# Patient Record
Sex: Female | Born: 1965 | Race: White | Hispanic: No | Marital: Married | Smoking: Never smoker
Health system: Southern US, Community
[De-identification: ages and names within clinical notes are randomized; demographics above are authoritative.]

## PROBLEM LIST (undated history)

## (undated) DIAGNOSIS — R7303 Prediabetes: Secondary | ICD-10-CM

## (undated) DIAGNOSIS — I10 Essential (primary) hypertension: Secondary | ICD-10-CM

## (undated) DIAGNOSIS — N201 Calculus of ureter: Secondary | ICD-10-CM

## (undated) DIAGNOSIS — Z87442 Personal history of urinary calculi: Secondary | ICD-10-CM

## (undated) DIAGNOSIS — R3915 Urgency of urination: Secondary | ICD-10-CM

## (undated) DIAGNOSIS — K219 Gastro-esophageal reflux disease without esophagitis: Secondary | ICD-10-CM

---

## 1988-11-01 HISTORY — PX: LAPAROSCOPIC TUBAL LIGATION: SHX1937

## 1999-11-09 ENCOUNTER — Other Ambulatory Visit: Admission: RE | Admit: 1999-11-09 | Discharge: 1999-11-09 | Payer: Self-pay | Admitting: Obstetrics and Gynecology

## 2011-11-18 ENCOUNTER — Emergency Department (HOSPITAL_COMMUNITY): Payer: PRIVATE HEALTH INSURANCE

## 2011-11-18 ENCOUNTER — Encounter (HOSPITAL_COMMUNITY): Payer: Self-pay

## 2011-11-18 ENCOUNTER — Emergency Department (HOSPITAL_COMMUNITY)
Admission: EM | Admit: 2011-11-18 | Discharge: 2011-11-18 | Disposition: A | Discharge status: Home / Self Care | Payer: PRIVATE HEALTH INSURANCE | Attending: Emergency Medicine | Admitting: Emergency Medicine

## 2011-11-18 DIAGNOSIS — R112 Nausea with vomiting, unspecified: Secondary | ICD-10-CM | POA: Insufficient documentation

## 2011-11-18 DIAGNOSIS — R109 Unspecified abdominal pain: Secondary | ICD-10-CM | POA: Insufficient documentation

## 2011-11-18 LAB — COMPREHENSIVE METABOLIC PANEL
AST: 17 U/L (ref 0–37)
Albumin: 4.5 g/dL (ref 3.5–5.2)
BUN: 7 mg/dL (ref 6–23)
Calcium: 9.7 mg/dL (ref 8.4–10.5)
Chloride: 103 mEq/L (ref 96–112)
Creatinine, Ser: 0.73 mg/dL (ref 0.50–1.10)
Total Bilirubin: 0.4 mg/dL (ref 0.3–1.2)

## 2011-11-18 LAB — URINE MICROSCOPIC-ADD ON

## 2011-11-18 LAB — CBC
Hemoglobin: 13.8 g/dL (ref 12.0–15.0)
MCV: 84.2 fL (ref 78.0–100.0)
Platelets: 269 10*3/uL (ref 150–400)
RBC: 4.75 MIL/uL (ref 3.87–5.11)
WBC: 5.4 10*3/uL (ref 4.0–10.5)

## 2011-11-18 LAB — URINALYSIS, ROUTINE W REFLEX MICROSCOPIC
Hgb urine dipstick: NEGATIVE
Nitrite: NEGATIVE
Specific Gravity, Urine: 1.021 (ref 1.005–1.030)
Urobilinogen, UA: 0.2 mg/dL (ref 0.0–1.0)
pH: 8.5 — ABNORMAL HIGH (ref 5.0–8.0)

## 2011-11-18 LAB — LIPASE, BLOOD: Lipase: 26 U/L (ref 11–59)

## 2011-11-18 LAB — PREGNANCY, URINE: Preg Test, Ur: NEGATIVE

## 2011-11-18 MED ORDER — PANTOPRAZOLE SODIUM 40 MG IV SOLR
40.0000 mg | Freq: Once | INTRAVENOUS | Status: AC
  Administered 2011-11-18: 40 mg via INTRAVENOUS
  Filled 2011-11-18: qty 40

## 2011-11-18 MED ORDER — HYDROMORPHONE HCL PF 1 MG/ML IJ SOLN
0.5000 mg | Freq: Once | INTRAMUSCULAR | Status: AC
  Administered 2011-11-18: 0.5 mg via INTRAVENOUS
  Filled 2011-11-18: qty 1

## 2011-11-18 MED ORDER — ONDANSETRON HCL 4 MG/2ML IJ SOLN
4.0000 mg | Freq: Once | INTRAMUSCULAR | Status: AC
  Administered 2011-11-18: 4 mg via INTRAVENOUS
  Filled 2011-11-18: qty 2

## 2011-11-18 MED ORDER — ONDANSETRON HCL 8 MG PO TABS: 8.0000 mg | ORAL_TABLET | Freq: Three times a day (TID) | ORAL | Status: AC | PRN

## 2011-11-18 MED ORDER — SODIUM CHLORIDE 0.9 % IV BOLUS (SEPSIS)
1000.0000 mL | Freq: Once | INTRAVENOUS | Status: AC
  Administered 2011-11-18: 1000 mL via INTRAVENOUS

## 2011-11-18 NOTE — ED Provider Notes (Signed)
History     CSN: [id number]  Arrival date & time 11/18/11  3435   First MD Initiated Contact with Patient 11/18/11 [phone]      Chief Complaint  Patient presents with  . Abdominal Pain  . Nausea  . Emesis    (Consider location/radiation/quality/duration/timing/severity/associated sxs/prior treatment) Patient is a 46 y.o. female presenting with abdominal pain and vomiting. The history is provided by the patient.  Abdominal Pain The primary symptoms of the illness include abdominal pain and vomiting. The primary symptoms of the illness do not include fever or shortness of breath.  Symptoms associated with the illness do not include chills or back pain.  Emesis  Associated symptoms include abdominal pain. Pertinent negatives include no chills, no fever and no headaches.  pt w onset nv last pm, multiple episodes since. Clear to sl yellowish. Not bloody. Mid abd cramping comes and goes, no constant, focal pain. Pt unaware of exacerbating or alleviating factors, no known bad food ingestion or ill contacts. No diarrhea or constipation. No prior abd surgery. lnmp 3 weeks ago, no discharge or bleeding. No gu c/o. No back or flank pain. No hx gallstones or pud. No fever or chills. No cp or sob.   History reviewed. No pertinent past medical history.  History reviewed. No pertinent past surgical history.  History reviewed. No pertinent family history.  History  Substance Use Topics  . Smoking status: Not on file  . Smokeless tobacco: Not on file  . Alcohol Use: No    OB History    Grav Para Term Preterm Abortions TAB SAB Ect Mult Living                  Review of Systems  Constitutional: Negative for fever and chills.  HENT: Negative for neck pain.   Eyes: Negative for redness.  Respiratory: Negative for shortness of breath.   Cardiovascular: Negative for chest pain.  Gastrointestinal: Positive for vomiting and abdominal pain.  Genitourinary: Negative for flank pain.    Musculoskeletal: Negative for back pain.  Skin: Negative for rash.  Neurological: Negative for headaches.  Hematological: Does not bruise/bleed easily.  Psychiatric/Behavioral: Negative for confusion.    Allergies  Review of patient's allergies indicates no known allergies.  Home Medications  No current outpatient prescriptions on file.  BP 155/92  Pulse 86  Temp(Src) 98 F (36.7 C) (Oral)  Resp 20  SpO2 97%  Physical Exam  Nursing note and vitals reviewed. Constitutional: She is oriented to person, place, and time. She appears well-developed and well-nourished. No distress.  HENT:  Head: Atraumatic.  Eyes: Conjunctivae are normal. No scleral icterus.  Neck: Neck supple. No tracheal deviation present.  Cardiovascular: Normal rate, regular rhythm, normal heart sounds and intact distal pulses.   Pulmonary/Chest: Effort normal and breath sounds normal. No respiratory distress.  Abdominal: Soft. Normal appearance and bowel sounds are normal. She exhibits no distension and no mass. There is no tenderness. There is no rebound and no guarding.  Musculoskeletal: She exhibits no edema.  Neurological: She is alert and oriented to person, place, and time.  Skin: Skin is warm and dry. No rash noted.  Psychiatric: She has a normal mood and affect.    ED Course  Procedures (including critical care time)   Labs Reviewed  LIPASE, BLOOD  COMPREHENSIVE METABOLIC PANEL  CBC  PREGNANCY, URINE  URINALYSIS, ROUTINE W REFLEX MICROSCOPIC    Results for orders placed during the hospital encounter of 11/18/11  LIPASE, BLOOD  Component Value Range   Lipase 26  11 - 59 (U/L)  COMPREHENSIVE METABOLIC PANEL      Component Value Range   Sodium 141  135 - 145 (mEq/L)   Potassium 3.5  3.5 - 5.1 (mEq/L)   Chloride 103  96 - 112 (mEq/L)   CO2 26  19 - 32 (mEq/L)   Glucose, Bld 155 (*) 70 - 99 (mg/dL)   BUN 7  6 - 23 (mg/dL)   Creatinine, Ser 6.57  0.50 - 1.10 (mg/dL)   Calcium 9.7   8.4 - 10.5 (mg/dL)   Total Protein 8.4 (*) 6.0 - 8.3 (g/dL)   Albumin 4.5  3.5 - 5.2 (g/dL)   AST 17  0 - 37 (U/L)   ALT 21  0 - 35 (U/L)   Alkaline Phosphatase 72  39 - 117 (U/L)   Total Bilirubin 0.4  0.3 - 1.2 (mg/dL)   GFR calc non Af Amer >90  >90 (mL/min)   GFR calc Af Amer >90  >90 (mL/min)  CBC      Component Value Range   WBC 5.4  4.0 - 10.5 (K/uL)   RBC 4.75  3.87 - 5.11 (MIL/uL)   Hemoglobin 13.8  12.0 - 15.0 (g/dL)   HCT 84.6  96.2 - 95.2 (%)   MCV 84.2  78.0 - 100.0 (fL)   MCH 29.1  26.0 - 34.0 (pg)   MCHC 34.5  30.0 - 36.0 (g/dL)   RDW 84.1  32.4 - 40.1 (%)   Platelets 269  150 - 400 (K/uL)  PREGNANCY, URINE      Component Value Range   Preg Test, Ur NEGATIVE    URINALYSIS, ROUTINE W REFLEX MICROSCOPIC      Component Value Range   Color, Urine YELLOW  YELLOW    APPearance TURBID (*) CLEAR    Specific Gravity, Urine 1.021  1.005 - 1.030    pH 8.5 (*) 5.0 - 8.0    Glucose, UA NEGATIVE  NEGATIVE (mg/dL)   Hgb urine dipstick NEGATIVE  NEGATIVE    Bilirubin Urine NEGATIVE  NEGATIVE    Ketones, ur NEGATIVE  NEGATIVE (mg/dL)   Protein, ur 30 (*) NEGATIVE (mg/dL)   Urobilinogen, UA 0.2  0.0 - 1.0 (mg/dL)   Nitrite NEGATIVE  NEGATIVE    Leukocytes, UA SMALL (*) NEGATIVE   URINE MICROSCOPIC-ADD ON      Component Value Range   Squamous Epithelial / LPF RARE  RARE    WBC, UA 0-2  <3 (WBC/hpf)   Bacteria, UA FEW (*) RARE    Urine-Other AMORPHOUS URATES/PHOSPHATES     US Abdomen Complete  11/18/2011  *RADIOLOGY REPORT*  Clinical Data:  Vomiting  ABDOMINAL ULTRASOUND COMPLETE  Comparison:  None.  Findings:  Gallbladder:  No gallstones, gallbladder wall thickening, or pericholecystic fluid. Negative sonographic Murphy's sign.  Common Bile Duct:  Within normal limits in caliber. Measures 5 mm.  Liver: No focal mass lesion identified. There is diffuse fatty infiltration.  IVC:  Appears normal.  Pancreas:  No abnormality identified.  Spleen:  Within normal limits in size and  echotexture.  Right kidney:  Normal in size and parenchymal echogenicity.  No evidence of mass or hydronephrosis.  Left kidney:  Normal in size and parenchymal echogenicity. Dromedary hump incidentally noted. No evidence of mass or hydronephrosis.  Abdominal Aorta:  No aneurysm identified.  IMPRESSION: There is diffuse fatty infiltration of the liver.  Original Report Authenticated By: Brandon Melnick, M.D.  MDM  Iv ns bolus. Dilaudid .5 mg iv. zofran iv. Labs.  Nursing notes reviewed, no prior ed charts to review.  2nd liter ns iv. Pt noted fam hx gallstones, and questions whether she may have gallstones ?u/s.  U/s neg for gallstones/acute processs.  Recheck improved. abd soft nt.       Suzi Roots, MD 11/18/11 [phone]

## 2011-11-18 NOTE — ED Notes (Signed)
Vital signs stable. 

## 2011-11-18 NOTE — ED Notes (Signed)
MD at bedside. 

## 2011-11-18 NOTE — ED Notes (Signed)
Pt complains of middle abd pain since last night, states that she went to bed nauseated and has been vomiting all night

## 2011-11-18 NOTE — ED Notes (Signed)
Patient transported to Ultrasound 

## 2011-11-18 NOTE — ED Notes (Signed)
Patient is resting comfortably. 

## 2011-11-18 NOTE — ED Notes (Signed)
Family at bedside. 

## 2012-11-01 HISTORY — PX: URETEROSCOPY WITH HOLMIUM LASER LITHOTRIPSY: SHX6645

## 2013-05-31 ENCOUNTER — Telehealth: Payer: Self-pay | Admitting: Family Medicine

## 2014-10-20 ENCOUNTER — Encounter (HOSPITAL_COMMUNITY): Payer: Self-pay | Admitting: Emergency Medicine

## 2014-10-20 ENCOUNTER — Emergency Department (HOSPITAL_COMMUNITY)
Admission: EM | Admit: 2014-10-20 | Discharge: 2014-10-20 | Disposition: A | Discharge status: Home / Self Care | Payer: PRIVATE HEALTH INSURANCE | Attending: Emergency Medicine | Admitting: Emergency Medicine

## 2014-10-20 ENCOUNTER — Emergency Department (HOSPITAL_COMMUNITY): Payer: PRIVATE HEALTH INSURANCE

## 2014-10-20 DIAGNOSIS — R1012 Left upper quadrant pain: Secondary | ICD-10-CM | POA: Diagnosis present

## 2014-10-20 DIAGNOSIS — Z3202 Encounter for pregnancy test, result negative: Secondary | ICD-10-CM | POA: Insufficient documentation

## 2014-10-20 DIAGNOSIS — N200 Calculus of kidney: Secondary | ICD-10-CM | POA: Insufficient documentation

## 2014-10-20 LAB — LIPASE, BLOOD: LIPASE: 33 U/L (ref 11–59)

## 2014-10-20 LAB — CBC WITH DIFFERENTIAL/PLATELET
Basophils Absolute: 0 10*3/uL (ref 0.0–0.1)
Basophils Relative: 0 % (ref 0–1)
Eosinophils Absolute: 0.1 10*3/uL (ref 0.0–0.7)
Eosinophils Relative: 1 % (ref 0–5)
HEMATOCRIT: 42.6 % (ref 36.0–46.0)
HEMOGLOBIN: 14.6 g/dL (ref 12.0–15.0)
LYMPHS ABS: 1.1 10*3/uL (ref 0.7–4.0)
LYMPHS PCT: 23 % (ref 12–46)
MCH: 31.1 pg (ref 26.0–34.0)
MCHC: 34.3 g/dL (ref 30.0–36.0)
MCV: 90.6 fL (ref 78.0–100.0)
MONO ABS: 0.5 10*3/uL (ref 0.1–1.0)
MONOS PCT: 10 % (ref 3–12)
NEUTROS ABS: 3.2 10*3/uL (ref 1.7–7.7)
Neutrophils Relative %: 66 % (ref 43–77)
Platelets: 214 10*3/uL (ref 150–400)
RBC: 4.7 MIL/uL (ref 3.87–5.11)
RDW: 12.2 % (ref 11.5–15.5)
WBC: 4.9 10*3/uL (ref 4.0–10.5)

## 2014-10-20 LAB — URINALYSIS, ROUTINE W REFLEX MICROSCOPIC
BILIRUBIN URINE: NEGATIVE
Glucose, UA: NEGATIVE mg/dL
KETONES UR: NEGATIVE mg/dL
NITRITE: NEGATIVE
Protein, ur: NEGATIVE mg/dL
Specific Gravity, Urine: 1.008 (ref 1.005–1.030)
UROBILINOGEN UA: 0.2 mg/dL (ref 0.0–1.0)
pH: 6 (ref 5.0–8.0)

## 2014-10-20 LAB — COMPREHENSIVE METABOLIC PANEL
ALK PHOS: 77 U/L (ref 39–117)
ALT: 26 U/L (ref 0–35)
ANION GAP: 15 (ref 5–15)
AST: 22 U/L (ref 0–37)
Albumin: 4.3 g/dL (ref 3.5–5.2)
BILIRUBIN TOTAL: 0.8 mg/dL (ref 0.3–1.2)
BUN: 11 mg/dL (ref 6–23)
CHLORIDE: 99 meq/L (ref 96–112)
CO2: 26 meq/L (ref 19–32)
CREATININE: 0.99 mg/dL (ref 0.50–1.10)
Calcium: 9.8 mg/dL (ref 8.4–10.5)
GFR calc Af Amer: 77 mL/min — ABNORMAL LOW (ref >90)
GFR, EST NON AFRICAN AMERICAN: 67 mL/min — AB (ref >90)
GLUCOSE: 106 mg/dL — AB (ref 70–99)
POTASSIUM: 4.2 meq/L (ref 3.7–5.3)
Sodium: 140 mEq/L (ref 137–147)
Total Protein: 8 g/dL (ref 6.0–8.3)

## 2014-10-20 LAB — URINE MICROSCOPIC-ADD ON

## 2014-10-20 LAB — I-STAT TROPONIN, ED: TROPONIN I, POC: 0 ng/mL (ref 0.00–0.08)

## 2014-10-20 LAB — POC URINE PREG, ED: Preg Test, Ur: NEGATIVE

## 2014-10-20 MED ORDER — KETOROLAC TROMETHAMINE 30 MG/ML IJ SOLN: 30.0000 mg | Freq: Once | INTRAMUSCULAR | Status: DC

## 2014-10-20 MED ORDER — IOHEXOL 300 MG/ML  SOLN
50.0000 mL | Freq: Once | INTRAMUSCULAR | Status: AC | PRN
  Administered 2014-10-20: 50 mL via ORAL

## 2014-10-20 MED ORDER — HYDROCODONE-ACETAMINOPHEN 5-325 MG PO TABS: 1.0000 | ORAL_TABLET | ORAL | Status: DC | PRN

## 2014-10-20 NOTE — ED Notes (Signed)
Pt c/o LLQ abdominal pain radiating to left flank at 0430 on 10-19-14, then has been intermittent since, with periods of exacerbation, at this time pain is dull. Pt c/o nausea, denies emesis or diarrhea.

## 2014-10-20 NOTE — Discharge Instructions (Signed)

## 2014-10-20 NOTE — ED Provider Notes (Signed)
CSN: [id number]     Arrival date & time 10/20/14  0721 History   First MD Initiated Contact with Patient 10/20/14 0725     Chief Complaint  Patient presents with  . Abdominal Pain      HPI  Patient presents with approximately 27 hours of abdominal pain.  Pain woke her from sleep.  Since onset has been intermittent, severe, sore, left upper quadrant, left lateral abdomen, nonradiating. There is associated nausea, but no fever or vomiting. No urinary changes. No other abdominal pain. No change in bowel movements. Patient completed antibiotics for urinary tract infection approximately one week ago. She states that that was her first urinary tract infection in memory.   History reviewed. No pertinent past medical history. History reviewed. No pertinent past surgical history. No family history on file. History  Substance Use Topics  . Smoking status: Never Smoker   . Smokeless tobacco: Not on file  . Alcohol Use: No   OB History    No data available     Review of Systems  Constitutional:       Per HPI, otherwise negative  HENT:       Per HPI, otherwise negative  Respiratory:       Per HPI, otherwise negative  Cardiovascular:       Per HPI, otherwise negative  Gastrointestinal: Negative for vomiting.  Endocrine:       Negative aside from HPI  Genitourinary:       Neg aside from HPI   Musculoskeletal:       Per HPI, otherwise negative  Skin: Negative.   Neurological: Negative for syncope.      Allergies  Review of patient's allergies indicates no known allergies.  Home Medications   Prior to Admission medications   Not on File   BP 132/82 mmHg  Pulse 71  Temp(Src) 97.6 F (36.4 C) (Oral)  Resp 15  SpO2 99%  LMP 06/20/2014 Physical Exam  Constitutional: She is oriented to person, place, and time. She appears well-developed and well-nourished. No distress.  HENT:  Head: Normocephalic and atraumatic.  Eyes: Conjunctivae and EOM are normal.   Cardiovascular: Normal rate and regular rhythm.   Pulmonary/Chest: Effort normal and breath sounds normal. No stridor. No respiratory distress.  Abdominal: She exhibits no distension.  Minimal tenderness to palpation about the left upper abdomen  Musculoskeletal: She exhibits no edema.  Neurological: She is alert and oriented to person, place, and time. No cranial nerve deficit.  Skin: Skin is warm and dry.  Psychiatric: She has a normal mood and affect.  Nursing note and vitals reviewed.   ED Course  Procedures (including critical care time) Labs Review Labs Reviewed  COMPREHENSIVE METABOLIC PANEL - Abnormal; Notable for the following:    Glucose, Bld 106 (*)    GFR calc non Af Amer 67 (*)    GFR calc Af Amer 77 (*)    All other components within normal limits  URINALYSIS, ROUTINE W REFLEX MICROSCOPIC - Abnormal; Notable for the following:    Hgb urine dipstick LARGE (*)    Leukocytes, UA TRACE (*)    All other components within normal limits  CBC WITH DIFFERENTIAL  LIPASE, BLOOD  URINE MICROSCOPIC-ADD ON  POC URINE PREG, ED  I-STAT TROPOININ, ED  POC URINE PREG, ED    Imaging Review Ct Abdomen Pelvis Wo Contrast  10/20/2014   CLINICAL DATA:  Left lower quadrant pain radiating to the left flank  EXAM: CT ABDOMEN AND PELVIS  WITHOUT CONTRAST  TECHNIQUE: Multidetector CT imaging of the abdomen and pelvis was performed following the standard protocol without IV contrast.  COMPARISON:  None.  FINDINGS: The lung bases are clear.  There is a 5 mm proximal left ureteral calculus with mild left hydronephrosis. No perinephric stranding is seen. The kidneys are symmetric in size without evidence for exophytic mass. The bladder is unremarkable.  The liver demonstrates no focal abnormality. The gallbladder is unremarkable. The spleen demonstrates no focal abnormality. The adrenal glands and pancreas are normal.  The stomach, duodenum, small intestine and large intestine demonstrate no wall  thickening or dilatation. There is a normal caliber appendix in the right lower quadrant without periappendiceal inflammatory changes. There is no pneumoperitoneum, pneumatosis, or portal venous gas. There is no abdominal or pelvic free fluid. There is no lymphadenopathy. The uterus and ovaries are unremarkable.  The abdominal aorta is normal in caliber.  There is degenerative disc disease at L5-S1.  IMPRESSION: 1. 5 mm proximal left ureteral calculus with mild left hydronephrosis.   Electronically Signed   By: Elige KoHetal  Patel   On: 10/20/2014 10:06     EKG Interpretation   Date/Time:  Sunday October 20 2014 07:35:33 EST Ventricular Rate:  81 PR Interval:  158 QRS Duration: 95 QT Interval:  376 QTC Calculation: 436 R Axis:   79 Text Interpretation:  Sinus rhythm Probable left atrial enlargement Sinus  rhythm Non-specific intra-ventricular conduction delay Borderline ECG  Confirmed by Gerhard MunchLOCKWOOD, Vikrant Pryce  MD [phone]([id number]) on 10/20/2014 7:48:33 AM     Update: Patient in no distress She and husband are aware of all results. MDM   Patient presents with new left-sided abdominal pain. Patient has CT e/o nephrolithiasis, w/o obstruction or concurrent infection. Patient had analgesia here and was d/c in stable condition to f/u w urology.        Gerhard Munchobert Erskine Steinfeldt, MD 10/20/14 [phone]

## 2015-04-11 ENCOUNTER — Telehealth: Payer: Self-pay | Admitting: Family Medicine

## 2015-04-11 NOTE — Telephone Encounter (Signed)
Gave patient number for the [facility]

## 2016-07-11 IMAGING — CT CT ABD-PELV W/O CM
1 series · 15 of 26 positions shown, 19 images · non-contrast
Comparison: None.

CLINICAL DATA: Left lower quadrant pain radiating to the left flank

EXAM:
CT ABDOMEN AND PELVIS WITHOUT CONTRAST
TECHNIQUE: Multidetector CT imaging of the abdomen and pelvis was performed
following the standard protocol without IV contrast.

[Series 4: lung · axial · 0.83mm/px · z∈[-112,+3]mm · 15 of 26 slices shown, 19 images]
[im 2/26  soft-tissue]
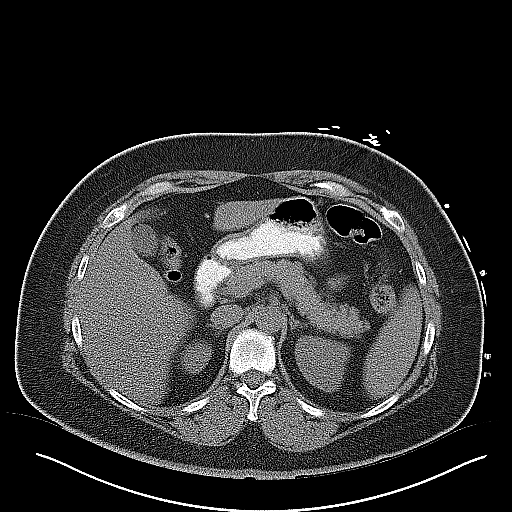
[im 2/26  bone]
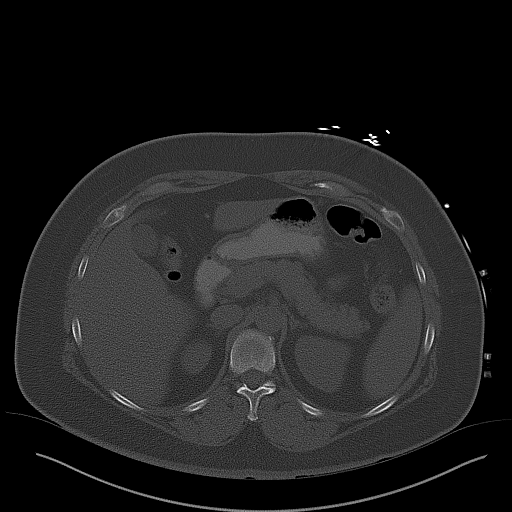
[im 4/26  soft-tissue]
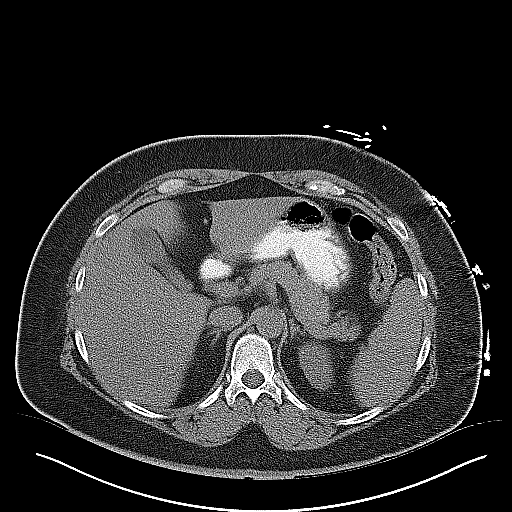
[im 6/26  soft-tissue]
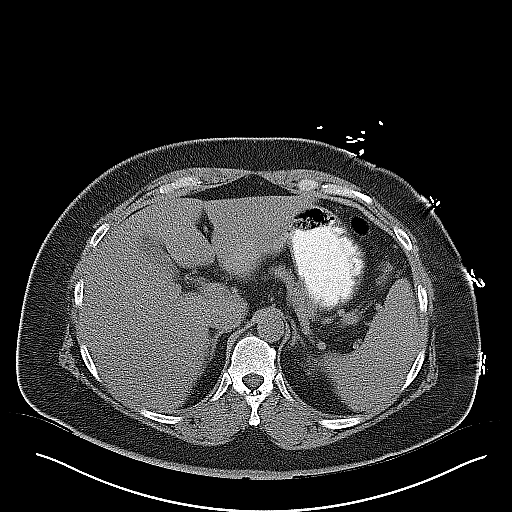
[im 8/26  soft-tissue]
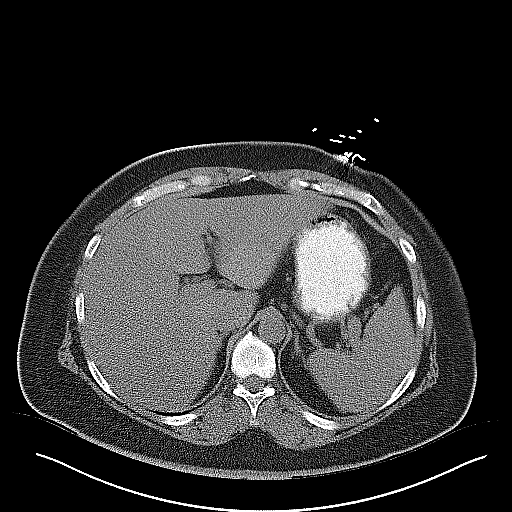
[im 10/26  soft-tissue]
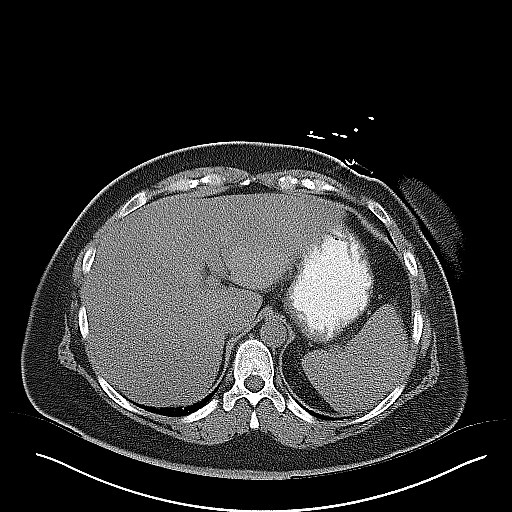
[im 12/26  soft-tissue]
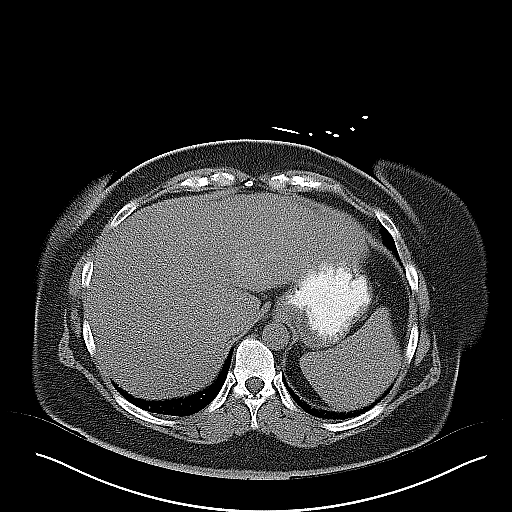
[im 14/26  soft-tissue]
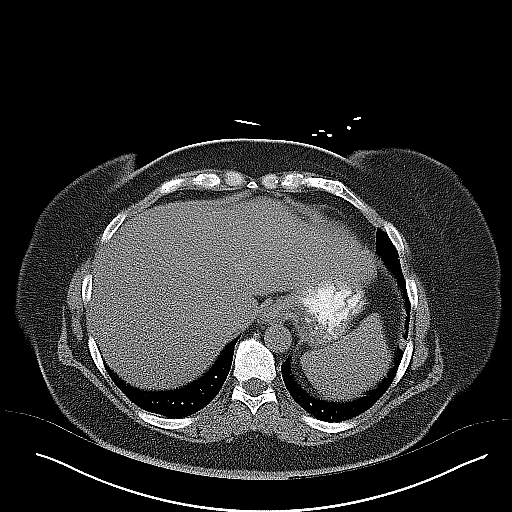
[im 15/26  soft-tissue]
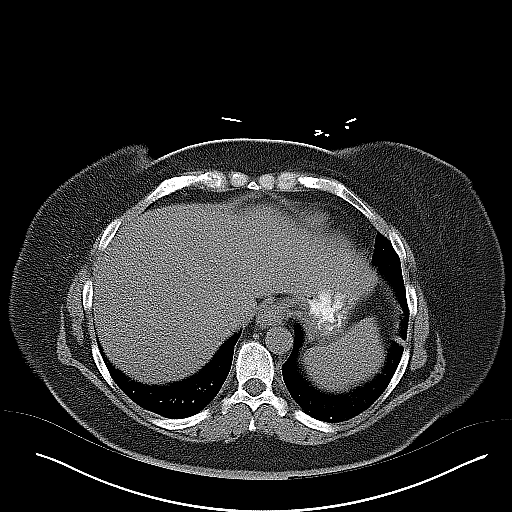
[im 17/26  soft-tissue]
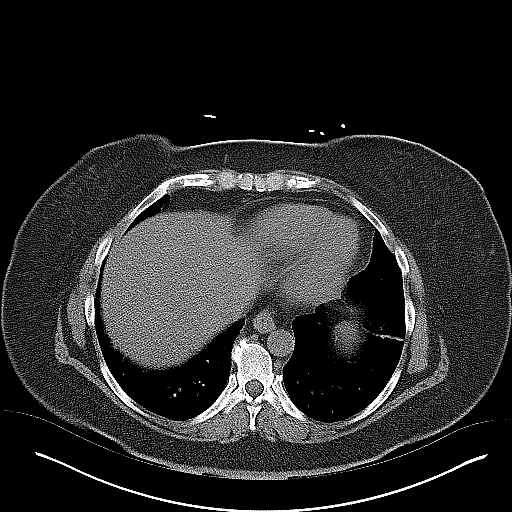
[im 17/26  bone]
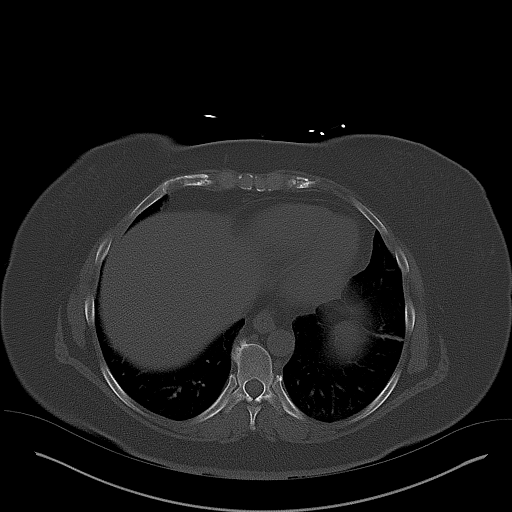
[im 19/26  soft-tissue]
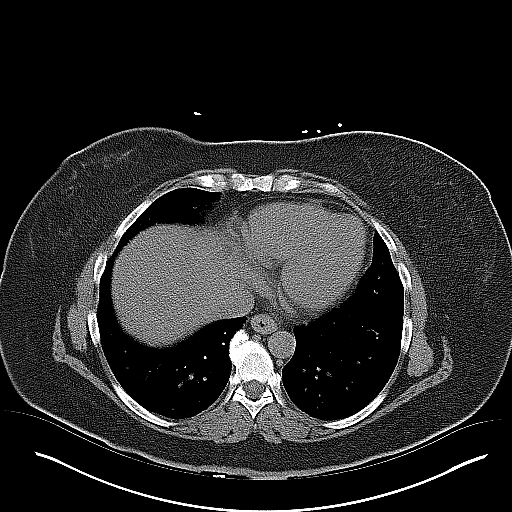
[im 21/26  soft-tissue]
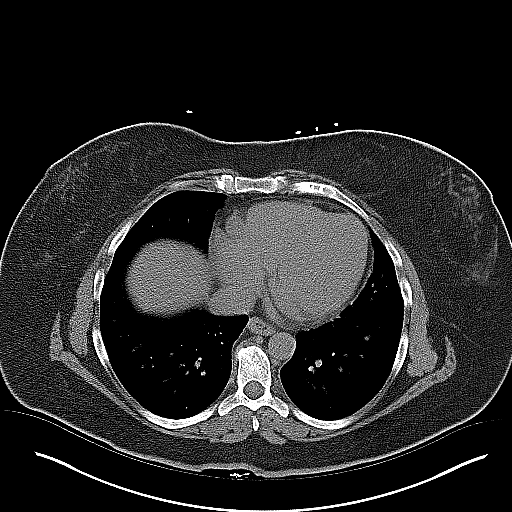
[im 22/26  lung]
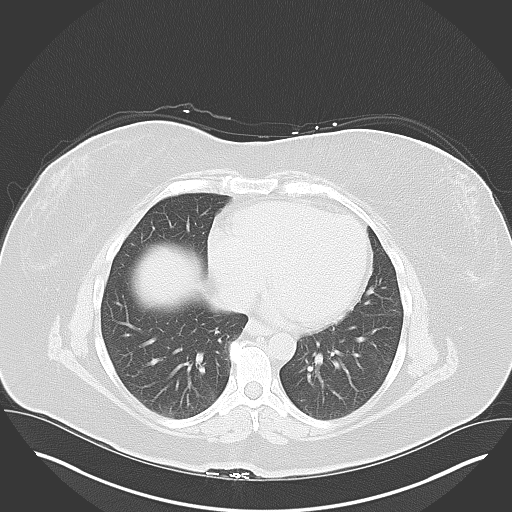
[im 23/26  soft-tissue]
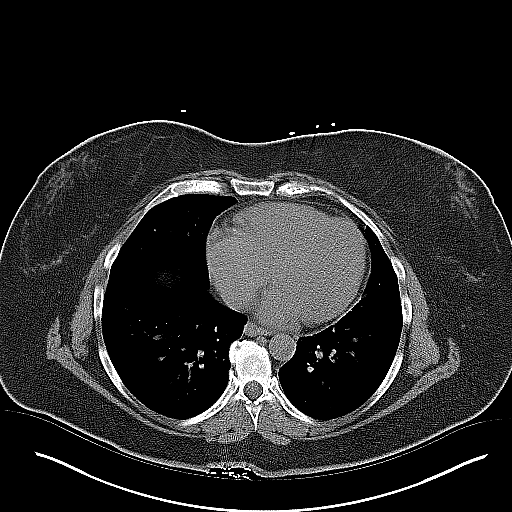
[im 23/26  lung]
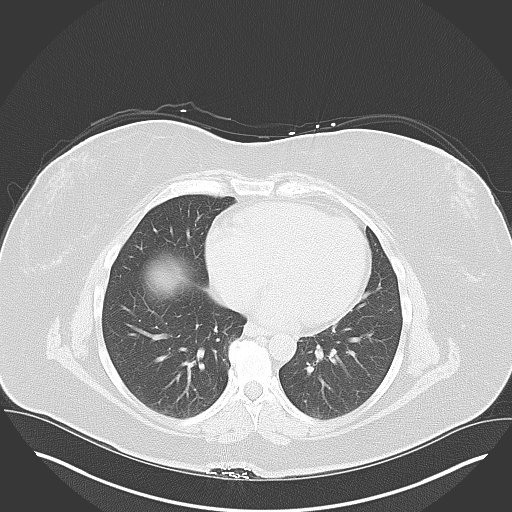
[im 24/26  lung]
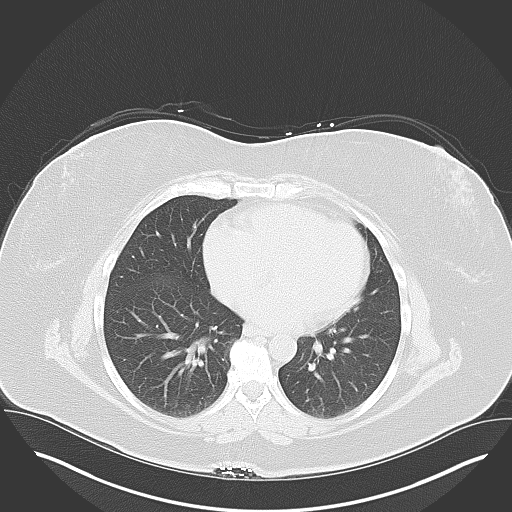
[im 25/26  soft-tissue]
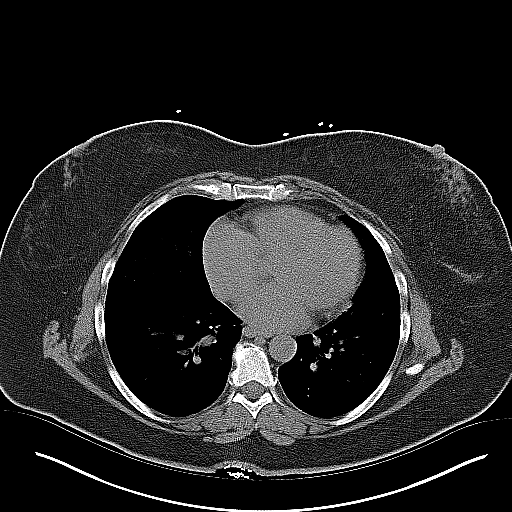
[im 25/26  lung]
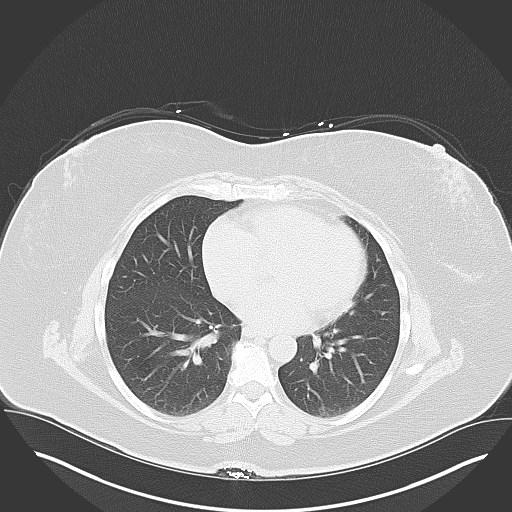

[15 of 26 positions shown; findings below may reference images not displayed]

FINDINGS: The lung bases are clear.

There is a 5 mm proximal left ureteral calculus with mild left
hydronephrosis. No perinephric stranding is seen. The kidneys are
symmetric in size without evidence for exophytic mass. The bladder
is unremarkable.

The liver demonstrates no focal abnormality. The gallbladder is
unremarkable. The spleen demonstrates no focal abnormality. The
adrenal glands and pancreas are normal.

The stomach, duodenum, small intestine and large intestine
demonstrate no wall thickening or dilatation. There is a normal
caliber appendix in the right lower quadrant without periappendiceal
inflammatory changes. There is no pneumoperitoneum, pneumatosis, or
portal venous gas. There is no abdominal or pelvic free fluid. There
is no lymphadenopathy. The uterus and ovaries are unremarkable.

The abdominal aorta is normal in caliber.

There is degenerative disc disease at L5-S1.
IMPRESSION: 1. 5 mm proximal left ureteral calculus with mild left
hydronephrosis.

## 2020-06-26 ENCOUNTER — Ambulatory Visit: Payer: PRIVATE HEALTH INSURANCE | Attending: Internal Medicine

## 2020-06-26 DIAGNOSIS — Z23 Encounter for immunization: Secondary | ICD-10-CM

## 2020-06-26 NOTE — Progress Notes (Signed)
° °  Covid-19 Vaccination Clinic  Name:  Katrina Freeman    MRN: [id number] DOB: May 15, 1966  06/26/2020  [name] was observed post Covid-19 immunization for 15 minutes without incident. She was provided with Vaccine Information Sheet and instruction to access the V-Safe system.   [name] was instructed to call 911 with any severe reactions post vaccine:  Difficulty breathing   Swelling of face and throat   A fast heartbeat   A bad rash all over body   Dizziness and weakness   Immunizations Administered    Name Date Dose VIS Date Route   Pfizer COVID-19 Vaccine 06/26/2020  4:44 PM 0.3 mL 12/26/2018 Intramuscular   Manufacturer: [employer], [employer]   Lot: [id number]   NDC: [address]-0

## 2020-07-17 ENCOUNTER — Ambulatory Visit: Payer: PRIVATE HEALTH INSURANCE | Attending: Internal Medicine

## 2020-07-17 DIAGNOSIS — Z23 Encounter for immunization: Secondary | ICD-10-CM

## 2020-07-17 NOTE — Progress Notes (Signed)
   Covid-19 Vaccination Clinic  Name:  Katrina Freeman    MRN: [id number] DOB: 1965/11/16  07/17/2020  [name] was observed post Covid-19 immunization for 15 minutes without incident. She was provided with Vaccine Information Sheet and instruction to access the V-Safe system.   [name] was instructed to call 911 with any severe reactions post vaccine: Marland Kitchen Difficulty breathing  . Swelling of face and throat  . A fast heartbeat  . A bad rash all over body  . Dizziness and weakness   Immunizations Administered    Name Date Dose VIS Date Route   Pfizer COVID-19 Vaccine 07/17/2020  4:38 PM 0.3 mL 12/26/2018 Intramuscular   Manufacturer: [employer], [employer]   Lot: [id number]   NDC: [id number]

## 2023-11-07 ENCOUNTER — Other Ambulatory Visit: Payer: Self-pay | Admitting: Urology

## 2023-11-17 ENCOUNTER — Ambulatory Visit: Payer: PRIVATE HEALTH INSURANCE | Admitting: Urology

## 2023-11-23 ENCOUNTER — Encounter (HOSPITAL_BASED_OUTPATIENT_CLINIC_OR_DEPARTMENT_OTHER): Payer: Self-pay | Admitting: Urology

## 2023-11-23 NOTE — Progress Notes (Signed)
Spoke w/ via phone for pre-op interview--- pt Lab needs dos----    [employer], ekg     Lab results------ no COVID test -----patient states asymptomatic no test needed Arrive at ------- 0530 on 11-25-2023 NPO after MN NO Solid Food.  Clear liquids from MN until--- 0430 Med rec completed Medications to take morning of surgery ----- norvasd Diabetic medication ----- n/a Patient instructed no nail polish to be worn day of surgery Patient instructed to bring photo id and insurance card day of surgery Patient aware to have Driver (ride ) / caregiver    for 24 hours after surgery - husband, todd Patient Special Instructions ----- n/a Pre-Op special Instructions ----- n/a Patient verbalized understanding of instructions that were given at this phone interview. Patient denies chest pain, sob, fever, cough at the interview.

## 2023-11-24 NOTE — Anesthesia Preprocedure Evaluation (Addendum)
Anesthesia Evaluation  Patient identified by MRN, date of birth, ID band Patient awake    Reviewed: Allergy & Precautions, NPO status , Patient's Chart, lab work & pertinent test results  History of Anesthesia Complications Negative for: history of anesthetic complications  Airway Mallampati: II  TM Distance: >3 FB Neck ROM: Full    Dental  (+) Dental Advisory Given   Pulmonary neg pulmonary ROS   Pulmonary exam normal breath sounds clear to auscultation       Cardiovascular hypertension (amlodipine), Pt. on medications (-) angina (-) Past MI, (-) Cardiac Stents and (-) CABG (-) dysrhythmias  Rhythm:Regular Rate:Normal     Neuro/Psych negative neurological ROS     GI/Hepatic Neg liver ROS,GERD  ,,  Endo/Other  Pre-diabetes  Renal/GU Renal disease (stones)     Musculoskeletal   Abdominal  (+) + obese  Peds  Hematology negative hematology ROS (+)   Anesthesia Other Findings   Reproductive/Obstetrics                             Anesthesia Physical Anesthesia Plan  ASA: 2  Anesthesia Plan: General   Post-op Pain Management: Tylenol PO (pre-op)*   Induction: Intravenous  PONV Risk Score and Plan: 3 and Ondansetron, Dexamethasone and Treatment may vary due to age or medical condition  Airway Management Planned: LMA  Additional Equipment:   Intra-op Plan:   Post-operative Plan: Extubation in OR  Informed Consent: I have reviewed the patients History and Physical, chart, labs and discussed the procedure including the risks, benefits and alternatives for the proposed anesthesia with the patient or authorized representative who has indicated his/her understanding and acceptance.     Dental advisory given  Plan Discussed with: CRNA and Anesthesiologist  Anesthesia Plan Comments: (Risks of general anesthesia discussed including, but not limited to, sore throat, hoarse voice,  chipped/damaged teeth, injury to vocal cords, nausea and vomiting, allergic reactions, lung infection, heart attack, stroke, and death. All questions answered. )       Anesthesia Quick Evaluation

## 2023-11-25 ENCOUNTER — Encounter (HOSPITAL_BASED_OUTPATIENT_CLINIC_OR_DEPARTMENT_OTHER): Payer: Self-pay | Admitting: Urology

## 2023-11-25 ENCOUNTER — Encounter (HOSPITAL_BASED_OUTPATIENT_CLINIC_OR_DEPARTMENT_OTHER)
Admission: RE | Disposition: A | Discharge status: Home / Self Care | Payer: Self-pay | Source: Home / Self Care | Attending: Urology

## 2023-11-25 ENCOUNTER — Ambulatory Visit (HOSPITAL_BASED_OUTPATIENT_CLINIC_OR_DEPARTMENT_OTHER)
Admission: RE | Admit: 2023-11-25 | Discharge: 2023-11-25 | Disposition: A | Discharge status: Home / Self Care | Payer: Managed Care, Other (non HMO) | Attending: Urology | Admitting: Urology

## 2023-11-25 ENCOUNTER — Ambulatory Visit (HOSPITAL_BASED_OUTPATIENT_CLINIC_OR_DEPARTMENT_OTHER): Payer: Managed Care, Other (non HMO) | Admitting: Anesthesiology

## 2023-11-25 ENCOUNTER — Other Ambulatory Visit: Payer: Self-pay

## 2023-11-25 DIAGNOSIS — N201 Calculus of ureter: Secondary | ICD-10-CM | POA: Diagnosis not present

## 2023-11-25 DIAGNOSIS — Z79899 Other long term (current) drug therapy: Secondary | ICD-10-CM | POA: Insufficient documentation

## 2023-11-25 DIAGNOSIS — N200 Calculus of kidney: Secondary | ICD-10-CM

## 2023-11-25 DIAGNOSIS — R3121 Asymptomatic microscopic hematuria: Secondary | ICD-10-CM | POA: Insufficient documentation

## 2023-11-25 DIAGNOSIS — I10 Essential (primary) hypertension: Secondary | ICD-10-CM | POA: Diagnosis not present

## 2023-11-25 DIAGNOSIS — N132 Hydronephrosis with renal and ureteral calculous obstruction: Secondary | ICD-10-CM | POA: Insufficient documentation

## 2023-11-25 DIAGNOSIS — R3915 Urgency of urination: Secondary | ICD-10-CM | POA: Insufficient documentation

## 2023-11-25 DIAGNOSIS — R7303 Prediabetes: Secondary | ICD-10-CM | POA: Insufficient documentation

## 2023-11-25 DIAGNOSIS — Z01818 Encounter for other preprocedural examination: Secondary | ICD-10-CM

## 2023-11-25 HISTORY — DX: Calculus of ureter: N20.1

## 2023-11-25 HISTORY — PX: CYSTOSCOPY/URETEROSCOPY/HOLMIUM LASER/STENT PLACEMENT: SHX6546

## 2023-11-25 HISTORY — DX: Urgency of urination: R39.15

## 2023-11-25 HISTORY — DX: Prediabetes: R73.03

## 2023-11-25 HISTORY — DX: Essential (primary) hypertension: I10

## 2023-11-25 HISTORY — DX: Personal history of urinary calculi: Z87.442

## 2023-11-25 HISTORY — DX: Gastro-esophageal reflux disease without esophagitis: K21.9

## 2023-11-25 LAB — POCT I-STAT, CHEM 8
BUN: 9 mg/dL (ref 6–20)
Calcium, Ion: 1.24 mmol/L (ref 1.15–1.40)
Chloride: 104 mmol/L (ref 98–111)
Creatinine, Ser: 0.9 mg/dL (ref 0.44–1.00)
Glucose, Bld: 140 mg/dL — ABNORMAL HIGH (ref 70–99)
HCT: 40 % (ref 36.0–46.0)
Hemoglobin: 13.6 g/dL (ref 12.0–15.0)
Potassium: 3.8 mmol/L (ref 3.5–5.1)
Sodium: 142 mmol/L (ref 135–145)
TCO2: 23 mmol/L (ref 22–32)

## 2023-11-25 SURGERY — CYSTOSCOPY/URETEROSCOPY/HOLMIUM LASER/STENT PLACEMENT
Anesthesia: General | Site: Renal | Laterality: Left

## 2023-11-25 MED ORDER — DEXAMETHASONE SODIUM PHOSPHATE 10 MG/ML IJ SOLN
INTRAMUSCULAR | Status: DC | PRN
  Administered 2023-11-25: 10 mg via INTRAVENOUS

## 2023-11-25 MED ORDER — CEFAZOLIN SODIUM-DEXTROSE 2-4 GM/100ML-% IV SOLN
INTRAVENOUS | Status: AC
  Filled 2023-11-25: qty 100

## 2023-11-25 MED ORDER — SODIUM CHLORIDE 0.9 % IR SOLN
Status: DC | PRN
  Administered 2023-11-25: 3000 mL via INTRAVESICAL

## 2023-11-25 MED ORDER — PROPOFOL 10 MG/ML IV BOLUS
INTRAVENOUS | Status: AC
  Filled 2023-11-25: qty 20

## 2023-11-25 MED ORDER — LACTATED RINGERS IV SOLN: INTRAVENOUS | Status: DC

## 2023-11-25 MED ORDER — MIDAZOLAM HCL 2 MG/2ML IJ SOLN
INTRAMUSCULAR | Status: DC | PRN
  Administered 2023-11-25: 2 mg via INTRAVENOUS

## 2023-11-25 MED ORDER — AMISULPRIDE (ANTIEMETIC) 5 MG/2ML IV SOLN: 10.0000 mg | Freq: Once | INTRAVENOUS | Status: DC | PRN

## 2023-11-25 MED ORDER — OXYCODONE HCL 5 MG PO TABS: 5.0000 mg | ORAL_TABLET | Freq: Once | ORAL | Status: DC | PRN

## 2023-11-25 MED ORDER — ACETAMINOPHEN 500 MG PO TABS
ORAL_TABLET | ORAL | Status: AC
  Filled 2023-11-25: qty 2

## 2023-11-25 MED ORDER — MIDAZOLAM HCL 2 MG/2ML IJ SOLN
INTRAMUSCULAR | Status: AC
  Filled 2023-11-25: qty 2

## 2023-11-25 MED ORDER — FENTANYL CITRATE (PF) 100 MCG/2ML IJ SOLN: 25.0000 ug | INTRAMUSCULAR | Status: DC | PRN

## 2023-11-25 MED ORDER — IOHEXOL 300 MG/ML  SOLN
INTRAMUSCULAR | Status: DC | PRN
  Administered 2023-11-25: 9 mL

## 2023-11-25 MED ORDER — LIDOCAINE 2% (20 MG/ML) 5 ML SYRINGE
INTRAMUSCULAR | Status: DC | PRN
  Administered 2023-11-25: 100 mg via INTRAVENOUS

## 2023-11-25 MED ORDER — CIPROFLOXACIN HCL 500 MG PO TABS: 500.0000 mg | ORAL_TABLET | Freq: Once | ORAL | 0 refills | Status: AC

## 2023-11-25 MED ORDER — PHENAZOPYRIDINE HCL 200 MG PO TABS: 200.0000 mg | ORAL_TABLET | Freq: Three times a day (TID) | ORAL | 0 refills | Status: AC | PRN

## 2023-11-25 MED ORDER — OXYCODONE HCL 5 MG/5ML PO SOLN: 5.0000 mg | Freq: Once | ORAL | Status: DC | PRN

## 2023-11-25 MED ORDER — FENTANYL CITRATE (PF) 250 MCG/5ML IJ SOLN
INTRAMUSCULAR | Status: DC | PRN
  Administered 2023-11-25 (×2): 50 ug via INTRAVENOUS

## 2023-11-25 MED ORDER — CEFAZOLIN SODIUM-DEXTROSE 2-4 GM/100ML-% IV SOLN
2.0000 g | INTRAVENOUS | Status: AC
  Administered 2023-11-25: 2 g via INTRAVENOUS

## 2023-11-25 MED ORDER — PROPOFOL 10 MG/ML IV BOLUS
INTRAVENOUS | Status: DC | PRN
  Administered 2023-11-25: 20 mg via INTRAVENOUS
  Administered 2023-11-25: 200 mg via INTRAVENOUS

## 2023-11-25 MED ORDER — TRAMADOL HCL 50 MG PO TABS: 50.0000 mg | ORAL_TABLET | Freq: Four times a day (QID) | ORAL | 0 refills | Status: AC | PRN

## 2023-11-25 MED ORDER — PHENYLEPHRINE 80 MCG/ML (10ML) SYRINGE FOR IV PUSH (FOR BLOOD PRESSURE SUPPORT)
PREFILLED_SYRINGE | INTRAVENOUS | Status: DC | PRN
  Administered 2023-11-25 (×2): 80 ug via INTRAVENOUS

## 2023-11-25 MED ORDER — ACETAMINOPHEN 500 MG PO TABS
1000.0000 mg | ORAL_TABLET | Freq: Once | ORAL | Status: AC
  Administered 2023-11-25: 1000 mg via ORAL

## 2023-11-25 MED ORDER — FENTANYL CITRATE (PF) 100 MCG/2ML IJ SOLN
INTRAMUSCULAR | Status: AC
  Filled 2023-11-25: qty 2

## 2023-11-25 MED ORDER — ONDANSETRON HCL 4 MG/2ML IJ SOLN
INTRAMUSCULAR | Status: DC | PRN
  Administered 2023-11-25: 4 mg via INTRAVENOUS

## 2023-11-25 SURGICAL SUPPLY — 21 items
BAG DRAIN URO-CYSTO SKYTR STRL (DRAIN) ×1 IMPLANT
BASKET LASER NITINOL 1.9FR (BASKET) IMPLANT
CATH URETERAL DUAL LUMEN 10F (MISCELLANEOUS) IMPLANT
CATH URETL OPEN 5X70 (CATHETERS) ×1 IMPLANT
CLOTH BEACON ORANGE TIMEOUT ST (SAFETY) ×1 IMPLANT
EXTRACTOR STONE 1.7FRX115CM (UROLOGICAL SUPPLIES) IMPLANT
EXTRACTOR STONE NITINOL NGAGE (UROLOGICAL SUPPLIES) IMPLANT
GLOVE BIO SURGEON STRL SZ7.5 (GLOVE) ×1 IMPLANT
GOWN STRL REUS W/TWL XL LVL3 (GOWN DISPOSABLE) ×1 IMPLANT
GUIDEWIRE STR DUAL SENSOR (WIRE) ×1 IMPLANT
IV NS IRRIG 3000ML ARTHROMATIC (IV SOLUTION) ×2 IMPLANT
KIT TURNOVER CYSTO (KITS) ×1 IMPLANT
LASER FIB FLEXIVA PULSE ID 365 (Laser) IMPLANT
MANIFOLD NEPTUNE II (INSTRUMENTS) ×1 IMPLANT
NS IRRIG 500ML POUR BTL (IV SOLUTION) ×1 IMPLANT
PACK CYSTO (CUSTOM PROCEDURE TRAY) ×1 IMPLANT
SLEEVE SCD COMPRESS KNEE MED (STOCKING) ×1 IMPLANT
STENT URET 6FRX24 CONTOUR (STENTS) IMPLANT
TRACTIP FLEXIVA PULS ID 200XHI (Laser) IMPLANT
TUBE CONNECTING 12X1/4 (SUCTIONS) IMPLANT
TUBING UROLOGY SET (TUBING) ×1 IMPLANT

## 2023-11-25 NOTE — Transfer of Care (Signed)
Immediate Anesthesia Transfer of Care Note  Patient: Katrina Freeman  Procedure(s) Performed: CYSTOSCOPY LEFT /URETEROSCOPY/HOLMIUM LASER/STENT PLACEMENT (Left: Renal)  Patient Location: PACU  Anesthesia Type:General  Level of Consciousness: drowsy and patient cooperative  Airway & Oxygen Therapy: Patient Spontanous Breathing  Post-op Assessment: Report given to RN and Post -op Vital signs reviewed and stable  Post vital signs: Reviewed and stable  Last Vitals:  Vitals Value Taken Time  BP 118/72 11/25/23 0858  Temp    Pulse 82 11/25/23 0900  Resp 10 11/25/23 0900  SpO2 92 % 11/25/23 0900  Vitals shown include unfiled device data.  Last Pain:  Vitals:   11/25/23 0600  TempSrc: Oral  PainSc: 0-No pain      Patients Stated Pain Goal: 1 (11/25/23 0600)  Complications: No notable events documented.

## 2023-11-25 NOTE — H&P (Signed)
History of kidney stones, urinary urgency   Patient presents today for evaluation and management of urinary urgency and hematuria. The patient states that she has long history of nephrolithiasis, and she is familiar with the flank pain and the voiding symptoms. She woke up 3 weeks ago with severe urinary urgency and bladder spasms. She was frequently going to the bathroom, and having empty voids. She was seen in the urgent care and was noted to have lots of blood in her urine. She was started on antibiotics, but then subsequently contacted that her urine culture was negative. She was started on tamsulosin at that time and her urinary urgency and frequency abated to some degree. She still has urgency, but she is not voiding as often. She denies seeing gross hematuria. She is not really having a lot of pain. She denies any nausea or vomiting. She denies any fevers or chills.   Patient has had a hysterectomy. She also has a shockwave lithotripsy of kidney stones in the past     ALLERGIES: No Known Drug Allergies    MEDICATIONS: Amlodipine Besylate 5 mg tablet  Biotin 10,000 mcg tablet,disintegrating  Centrum Silver Women  Vitamin D3  Vitamin K 100 mcg tablet     GU PSH: ESWL - about 2020     NON-GU PSH: Bilateral Tubal Ligation     GU PMH: None   NON-GU PMH: Encounter for general adult medical examination without abnormal findings, Encounter for preventive health examination Hypertension    FAMILY HISTORY: 1 Daughter - Runs in Family 1 son - Runs in Family Atrial Fibrillation - Father Bladder Cancer - Father Colon Cancer - Mother father deceased - Runs in Family mother deceased - Runs in Family ovarian cancer - Mother   SOCIAL HISTORY: Marital Status: Married Ethnicity: Not Hispanic Or Latino; Race: White Current Smoking Status: Patient has never smoked.   Tobacco Use Assessment Completed: Used Tobacco in last 30 days? Drinks 2 caffeinated drinks per day.    REVIEW OF  SYSTEMS:    GU Review Female:   blood in urine.  Patient reports frequent urination, hard to postpone urination, get up at night to urinate, and stream starts and stops. Patient denies burning /pain with urination, leakage of urine, trouble starting your stream, have to strain to urinate, and being pregnant.  Gastrointestinal (Upper):   Patient reports nausea and indigestion/ heartburn.   Gastrointestinal (Lower):   Patient reports diarrhea and constipation.   Constitutional:   Patient reports fever, night sweats, and fatigue.   Skin:   Patient denies skin rash/ lesion and itching.  Eyes:   Patient denies blurred vision and double vision.  Ears/ Nose/ Throat:   Patient denies sore throat and sinus problems.  Hematologic/Lymphatic:   Patient denies swollen glands and easy bruising.  Cardiovascular:   Patient denies chest pains and leg swelling.  Respiratory:   Patient denies cough and shortness of breath.  Endocrine:   Patient denies excessive thirst.  Musculoskeletal:   Patient reports back pain and joint pain.   Neurological:   Patient denies headaches and dizziness.  Psychologic:   Patient denies depression and anxiety.   Notes: Had stomach virus for the nausea.    VITAL SIGNS:      11/04/2023 01:07 PM  Weight 220 lb / 99.79 kg  Height 66 in / 167.64 cm  BP 142/80 mmHg  Pulse 82 /min  Temperature 98.2 F / 36.7 C  BMI 35.5 kg/m   MULTI-SYSTEM PHYSICAL EXAMINATION:  Constitutional: Well-nourished. No physical deformities. Normally developed. Good grooming.  Neck: Neck symmetrical, not swollen. Normal tracheal position.  Respiratory: Normal breath sounds. No labored breathing, no use of accessory muscles.   Cardiovascular: Regular rate and rhythm. No murmur, no gallop. Normal temperature, normal extremity pulses, no swelling, no varicosities.   Lymphatic: No enlargement of neck, axillae, groin.  Skin: No paleness, no jaundice, no cyanosis. No lesion, no ulcer, no rash.   Neurologic / Psychiatric: Oriented to time, oriented to place, oriented to person. No depression, no anxiety, no agitation.  Gastrointestinal: No mass, no tenderness, no rigidity, non obese abdomen.  Eyes: Normal conjunctivae. Normal eyelids.  Ears, Nose, Mouth, and Throat: Left ear no scars, no lesions, no masses. Right ear no scars, no lesions, no masses. Nose no scars, no lesions, no masses. Normal hearing. Normal lips.  Musculoskeletal: Normal gait and station of head and neck.     Complexity of Data:  Source Of History:  Patient  Records Review:   Previous Doctor Records, Previous Patient Records, POC Tool  Urine Test Review:   Urinalysis  X-Ray Review: C.T. Abdomen/Pelvis: Reviewed Films. Discussed With Patient.     PROCEDURES:         C.T. ABD-Pelv w/o - 16109  The CT scan shows a left-sided distal ureteral stone during 8 x 6 x 10 mm with proximal hydroureteronephrosis.      Patient confirmed No Neulasta OnPro Device.           Urinalysis w/Scope Dipstick Dipstick Cont'd Micro  Color: Straw Bilirubin: Neg mg/dL WBC/hpf: NS (Not Seen)  Appearance: Clear Ketones: Neg mg/dL RBC/hpf: 3 - 60/AVW  Specific Gravity: 1.010 Blood: Trace ery/uL Bacteria: Few (10-25/hpf)  pH: 7.5 Protein: Trace mg/dL Cystals: Amorph Phosphates  Glucose: Neg mg/dL Urobilinogen: 0.2 mg/dL Casts: NS (Not Seen)    Nitrites: Neg Trichomonas: Not Present    Leukocyte Esterase: Neg leu/uL Mucous: Not Present      Epithelial Cells: 0 - 5/hpf      Yeast: NS (Not Seen)      Sperm: Not Present    ASSESSMENT:      ICD-10 Details  1 GU:   Microscopic hematuria - R31.21   2   History of urolithiasis - Z87.442   3   Urinary Urgency - R39.15    PLAN:           Orders X-Rays: C.T. Abdomen/Pelvis Without I.V. Contrast  X-Ray Notes: History:   Hematuria: Yes / No   Patient to see MD after exam: Yes/ No   Previous exam:   When:   Where:   Diabetic: Yes / No   BUN/ Creatinine:   Date of last  BUN Creatinine:   Weight in pounds:   Allergy- IV Contrast: Yes/ No  Prior Authorization #Rutherford Nail #[id number] valid 11/04/23 thru 05/02/24           Schedule         Document Letter(s):  Created for Patient: Clinical Summary         Notes:   The patient has a left distal ureteral stone is quite large. She has associated hydronephrosis. It is hard to know if the stone has been there a long time or not, but I do suspect it does have some chronicity to it. This is based on the fact that she is fairly asymptomatic aside from some urgency. I recommended treatment of the stone. I offered her shockwave lithotripsy and ureteroscopy. She wanted to proceed  with ureteroscopy, and I reviewed that procedure with her in detail. Will try to get scheduled for ASAP.

## 2023-11-25 NOTE — Discharge Instructions (Addendum)
DISCHARGE INSTRUCTIONS FOR KIDNEY STONE/URETERAL STENT   MEDICATIONS:  1.  Resume all your other meds from home - except do not take any extra narcotic pain meds that you may have at home.  2. Pyridium is to help with the burning/stinging when you urinate. 3. Tramadol is for moderate/severe pain, otherwise taking upto 1000 mg every 6 hours of plainTylenol will help treat your pain.   4. Take Cipro one hour prior to removal of your stent.   ACTIVITY:  1. No strenuous activity x 1week  2. No driving while on narcotic pain medications  3. Drink plenty of water  4. Continue to walk at home - you can still get blood clots when you are at home, so keep active, but don't over do it.  5. May return to work/school tomorrow or when you feel ready   BATHING:  1. You can shower and we recommend daily showers  2. You have a string coming from your urethra: The stent string is attached to your ureteral stent. Do not pull on this.   SIGNS/SYMPTOMS TO CALL:  Please call Katrina Freeman if you have a fever greater than 101.5, uncontrolled nausea/vomiting, uncontrolled pain, dizziness, unable to urinate, bloody urine, chest pain, shortness of breath, leg swelling, leg pain, redness around wound, drainage from wound, or any other concerns or questions.   You can reach Katrina Freeman at [phone].   FOLLOW-UP:  1. You have an appointment in 6 weeks with a ultrasound of your kidneys prior.   2. You have a string attached to your stent, you may remove it on Jan 27th. To do this, pull the strings until the stents are completely removed. You may feel an odd sensation in your back.     [facility] [phone] Post Ureteroscopy With or Without Stent Instructions  Definitions:  Ureter: The duct that transports urine from the kidney to the bladder. Stent:   A plastic hollow tube that is placed into the ureter, from the kidney to the bladder to prevent the ureter from swelling shut.  GENERAL  INSTRUCTIONS:  Despite the fact that no skin incisions were used, the area around the ureter and bladder is raw and irritated. The stent is a foreign body which will further irritate the bladder wall. This irritation is manifested by increased frequency of urination, both day and night, and by an increase in the urge to urinate. In some, the urge to urinate is present almost always. Sometimes the urge is strong enough that you may not be able to stop yourself from urinating. The only real cure is to remove the stent and then give time for the bladder wall to heal which can't be done until the danger of the ureter swelling shut has passed, which varies.  You may see some blood in your urine while the stent is in place and a few days afterwards. Do not be alarmed, even if the urine was clear for a while. Get off your feet and drink lots of fluids until clearing occurs. If you start to pass clots or don't improve, call Katrina Freeman.  DIET: You may return to your normal diet immediately. Because of the raw surface of your bladder, alcohol, spicy foods, acid type foods and drinks with caffeine may cause irritation or frequency and should be used in moderation. To keep your urine flowing freely and to avoid constipation, drink plenty of fluids during the day ( 8-10 glasses ). Tip: Avoid cranberry juice because it is very acidic.  ACTIVITY: Your physical activity doesn't need to be restricted. However, if you are very active, you may see some blood in your urine. We suggest that you reduce your activity under these circumstances until the bleeding has stopped.  BOWELS: It is important to keep your bowels regular during the postoperative period. Straining with bowel movements can cause bleeding. A bowel movement every other day is reasonable. Use a mild laxative if needed, such as Milk of Magnesia 2-3 tablespoons, or 2 Dulcolax tablets. Call if you continue to have problems. If you have been taking narcotics for pain,  before, during or after your surgery, you may be constipated. Take a laxative if necessary.   MEDICATION: You should resume your pre-surgery medications unless told not to. In addition you will often be given an antibiotic to prevent infection. These should be taken as prescribed until the bottles are finished unless you are having an unusual reaction to one of the drugs.  PROBLEMS YOU SHOULD REPORT TO Katrina Freeman: Fevers over 100.5 Fahrenheit. Heavy bleeding, or clots ( See above notes about blood in urine ). Inability to urinate. Drug reactions ( hives, rash, nausea, vomiting, diarrhea ). Severe burning or pain with urination that is not improving.  FOLLOW-UP: You will need a follow-up appointment to monitor your progress. Call for this appointment at the number listed above. Usually the first appointment will be about three to fourteen days after your surgery.     Post Anesthesia Home Care Instructions  Activity: Get plenty of rest for the remainder of the day. A responsible individual must stay with you for 24 hours following the procedure.  For the next 24 hours, DO NOT: -Drive a car -Advertising copywriter -Drink alcoholic beverages -Take any medication unless instructed by your physician -Make any legal decisions or sign important papers.  Meals: Start with liquid foods such as gelatin or soup. Progress to regular foods as tolerated. Avoid greasy, spicy, heavy foods. If nausea and/or vomiting occur, drink only clear liquids until the nausea and/or vomiting subsides. Call your physician if vomiting continues.  Special Instructions/Symptoms: Your throat may feel dry or sore from the anesthesia or the breathing tube placed in your throat during surgery. If this causes discomfort, gargle with warm salt water. The discomfort should disappear within 24 hours.         No acetaminophen/Tylenol until after 12:03 pm today if needed.

## 2023-11-25 NOTE — Op Note (Signed)
Preoperative diagnosis: left ureteral calculus  Postoperative diagnosis: left ureteral calculus  Procedure:  Cystoscopy left ureteroscopy and stone removal Ureteroscopic laser lithotripsy left 37F x 24cm ureteral stent placement  left retrograde pyelography with interpretation  Surgeon: Crist Fat, MD  Anesthesia: General  Complications: None  Intraoperative findings: left retrograde pyelography demonstrated a filling defect within the left ureter consistent with the patient's known calculus without other abnormalities.  EBL: Minimal  Specimens: left ureteral calculus  Disposition of specimens: [facility] for stone analysis  Indication: Katrina Freeman is a 58 y.o.   patient with a left ureteral stone and associated left symptoms. After reviewing the management options for treatment, the patient elected to proceed with the above surgical procedure(s). We have discussed the potential benefits and risks of the procedure, side effects of the proposed treatment, the likelihood of the patient achieving the goals of the procedure, and any potential problems that might occur during the procedure or recuperation. Informed consent has been obtained.   Description of procedure:  The patient was taken to the operating room and general anesthesia was induced.  The patient was placed in the dorsal lithotomy position, prepped and draped in the usual sterile fashion, and preoperative antibiotics were administered. A preoperative time-out was performed.   Cystourethroscopy was performed.  The patient's urethra was examined and was normal.  The bladder was then systematically examined in its entirety. There was no evidence for any bladder tumors, stones, or other mucosal pathology.    Attention then turned to the left ureteral orifice and a ureteral catheter was used to intubate the ureteral orifice.  Omnipaque contrast was injected through the ureteral catheter and a  retrograde pyelogram was performed with findings as dictated above.  A 0.38 sensor guidewire was then advanced up the left ureter into the renal pelvis under fluoroscopic guidance. The 6 Fr semirigid ureteroscope was then advanced into the ureter next to the guidewire and the calculus was identified.   The stone was then fragmented with the 365 micron holmium laser fiber on a setting of 1.0 and frequency of 20 Hz.   All stones were then removed from the ureter with an N-gage nitinol basket.  Reinspection of the ureter revealed no remaining visible stones or fragments.   The wire was then backloaded through the cystoscope and a ureteral stent was advance over the wire using Seldinger technique.  The stent was positioned appropriately under fluoroscopic and cystoscopic guidance.  The wire was then removed with an adequate stent curl noted in the renal pelvis as well as in the bladder.  The bladder was then emptied and the procedure ended.  The patient appeared to tolerate the procedure well and without complications.  The patient was able to be awakened and transferred to the recovery unit in satisfactory condition.   Disposition: The tether of the stent was left on and secured to the tucked inside the patient's vagina.  Instructions for removing the stent have been provided to the patient. The patient has been scheduled for followup in 6 weeks with a renal ultrasound.

## 2023-11-25 NOTE — Anesthesia Procedure Notes (Signed)
Procedure Name: LMA Insertion Date/Time: 11/25/2023 7:42 AM  Performed by: Dairl Ponder, CRNAPre-anesthesia Checklist: Patient identified, Emergency Drugs available, Suction available and Patient being monitored Patient Re-evaluated:Patient Re-evaluated prior to induction Oxygen Delivery Method: Circle System Utilized Preoxygenation: Pre-oxygenation with 100% oxygen Induction Type: IV induction Ventilation: Mask ventilation without difficulty LMA: LMA inserted LMA Size: 4.0 Number of attempts: 1 Airway Equipment and Method: Bite block Placement Confirmation: positive ETCO2 Tube secured with: Tape Dental Injury: Teeth and Oropharynx as per pre-operative assessment

## 2023-11-25 NOTE — Anesthesia Postprocedure Evaluation (Signed)
Anesthesia Post Note  Patient: Katrina Freeman  Procedure(s) Performed: CYSTOSCOPY LEFT /URETEROSCOPY/HOLMIUM LASER/STENT PLACEMENT (Left: Renal)     Patient location during evaluation: PACU Anesthesia Type: General Level of consciousness: awake Pain management: pain level controlled Vital Signs Assessment: post-procedure vital signs reviewed and stable Respiratory status: spontaneous breathing, nonlabored ventilation and respiratory function stable Cardiovascular status: blood pressure returned to baseline and stable Postop Assessment: no apparent nausea or vomiting Anesthetic complications: no   No notable events documented.  Last Vitals:  Vitals:   11/25/23 0930 11/25/23 0945  BP: 128/81   Pulse: 83   Resp: 13   Temp:  36.6 C  SpO2: 92%     Last Pain:  Vitals:   11/25/23 0945  TempSrc:   PainSc: 0-No pain                 Linton Rump

## 2023-11-26 ENCOUNTER — Encounter (HOSPITAL_BASED_OUTPATIENT_CLINIC_OR_DEPARTMENT_OTHER): Payer: Self-pay | Admitting: Urology

## 2023-11-30 LAB — CALCULI, WITH PHOTOGRAPH (CLINICAL LAB)
Calcium Oxalate Dihydrate: 80 %
Calcium Oxalate Monohydrate: 15 %
Hydroxyapatite: 5 %
Weight Calculi: 120 mg
